# Patient Record
Sex: Male | Born: 2000 | Race: White | Hispanic: No | Marital: Single
Health system: Southern US, Community
[De-identification: ages and names within clinical notes are randomized; demographics above are authoritative.]

---
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
## 2022-03-10 IMAGING — CR DG FOOT COMPLETE 3+V*R*
3 series · 3 of 3 positions shown · non-contrast
Comparison: None.

CLINICAL DATA: Anterolateral right foot and ankle pain after injury

EXAM:
RIGHT ANKLE - COMPLETE 3+ VIEW; RIGHT FOOT COMPLETE - 3+ VIEW

[foot ap]
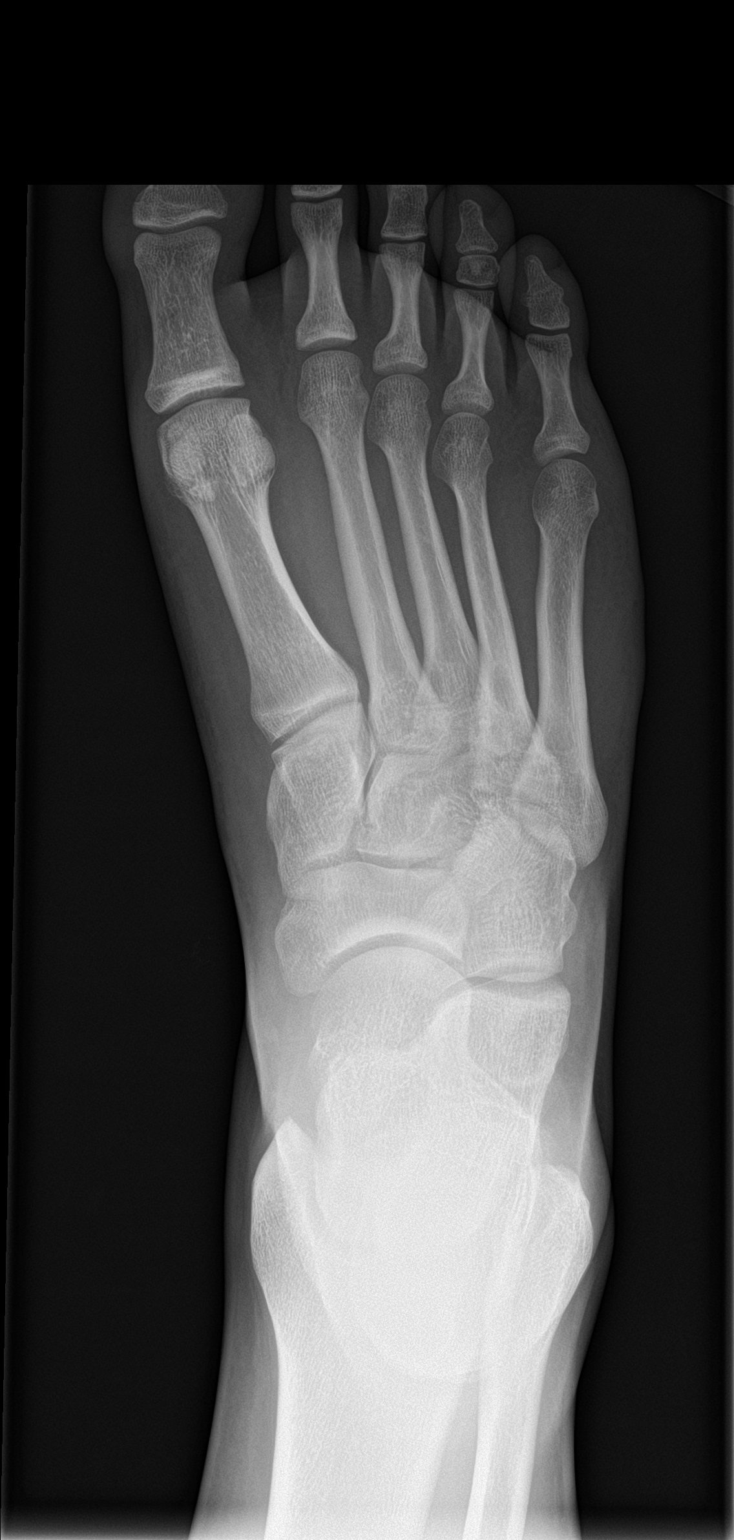

[foot obl]
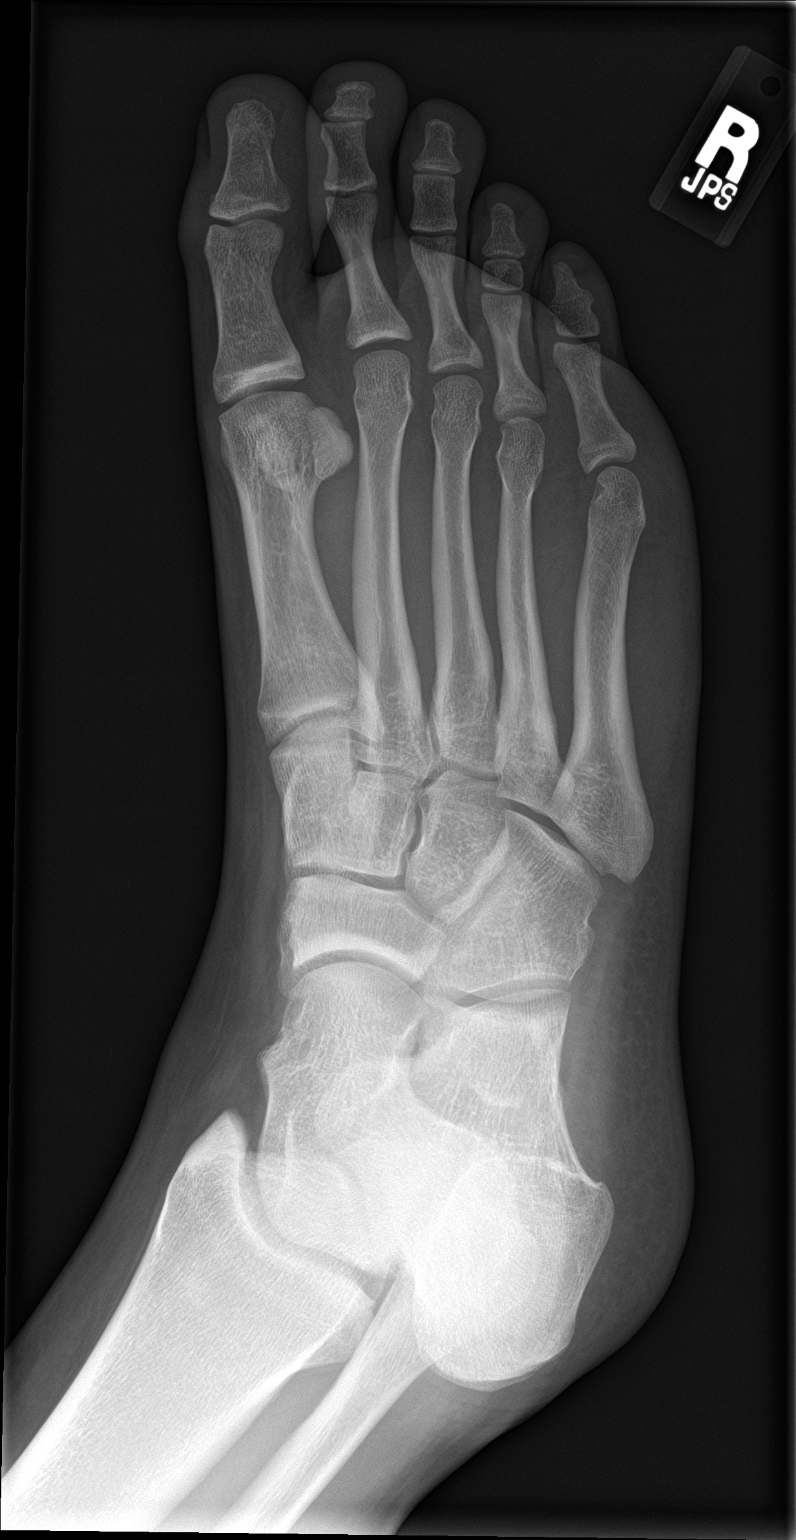

[foot lat]
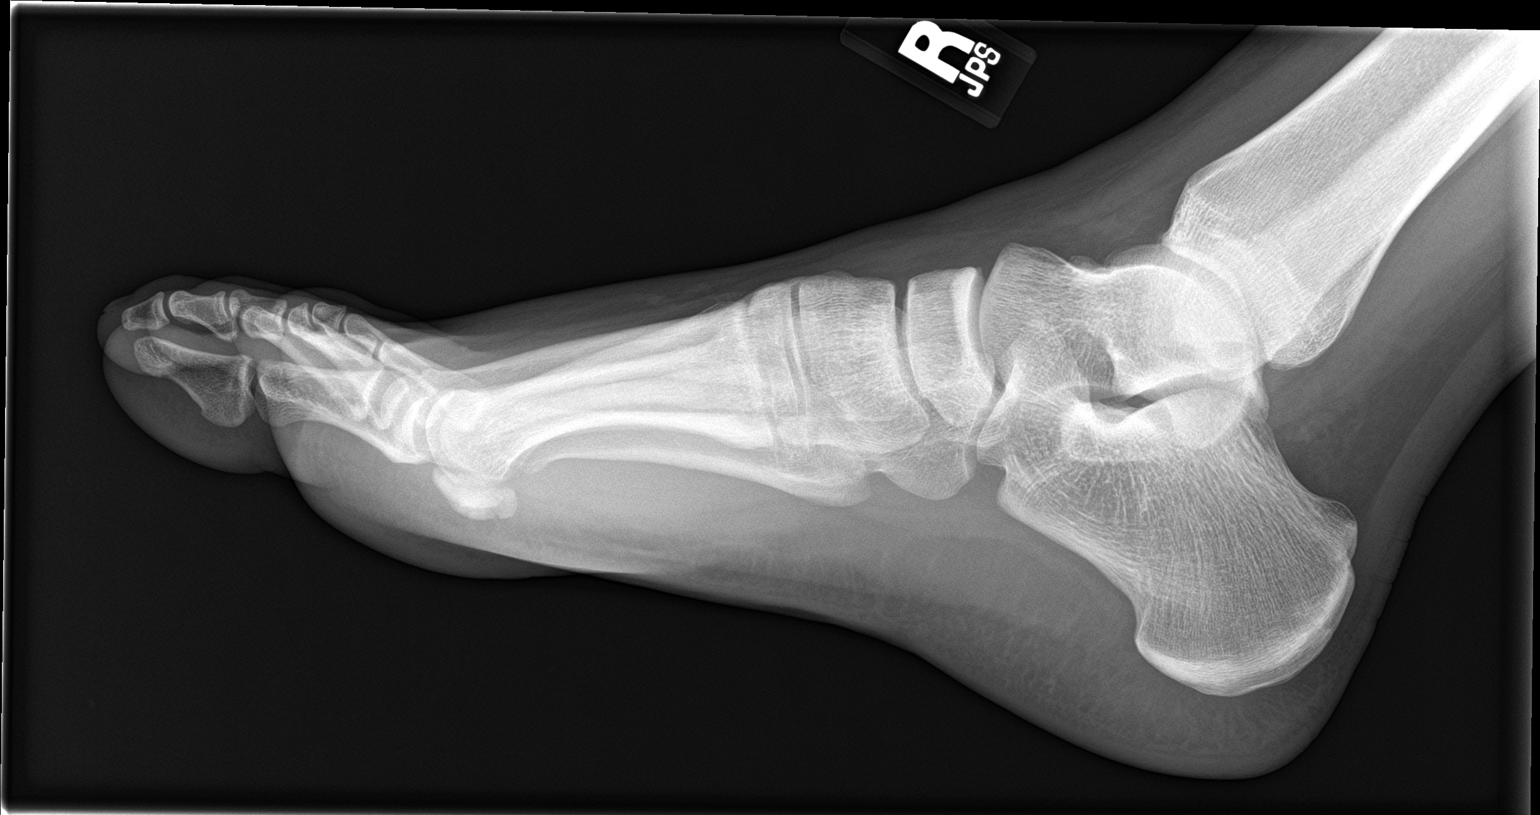

[3 of 3 positions shown; findings below may reference images not displayed]

FINDINGS: There is no evidence of fracture, dislocation, or joint effusion.
Ankle mortise is congruent. There is no evidence of arthropathy or
other focal bone abnormality. Soft tissues are unremarkable.
IMPRESSION: No acute osseous abnormality of the right foot or ankle.
